# Patient Record
Sex: Female | Born: 1965 | Race: Black or African American | Hispanic: No | Marital: Single | Smoking: Current every day smoker
Health system: Southern US, Community
[De-identification: ages and names within clinical notes are randomized; demographics above are authoritative.]

## PROBLEM LIST (undated history)

## (undated) DIAGNOSIS — K859 Acute pancreatitis without necrosis or infection, unspecified: Secondary | ICD-10-CM

## (undated) DIAGNOSIS — K7290 Hepatic failure, unspecified without coma: Secondary | ICD-10-CM

---

## 2002-03-01 ENCOUNTER — Emergency Department (HOSPITAL_COMMUNITY): Admission: EM | Admit: 2002-03-01 | Discharge: 2002-03-01 | Payer: Self-pay | Admitting: Emergency Medicine

## 2002-03-02 ENCOUNTER — Encounter: Payer: Self-pay | Admitting: Emergency Medicine

## 2010-11-18 ENCOUNTER — Emergency Department: Payer: Self-pay | Admitting: Emergency Medicine

## 2010-11-19 ENCOUNTER — Emergency Department: Payer: Self-pay | Admitting: Emergency Medicine

## 2011-06-15 ENCOUNTER — Emergency Department: Payer: Self-pay | Admitting: Emergency Medicine

## 2011-12-15 IMAGING — CT CT HEAD WITHOUT CONTRAST
2 series · 16 of 30 positions shown, 20 images · non-contrast
Comparison: none

REASON FOR EXAM: L sided headache
COMMENTS:

PROCEDURE:     CT  - CT HEAD WITHOUT CONTRAST  - June 15, 2011  [DATE]
RESULT:     History: Headache.
Comparison Study: No prior.

[Series 2: without · axial · non-contrast · 0.43mm/px · z∈[-78,+42]mm · 13 of 30 slices shown, 17 images]
[im 3/30  brain]
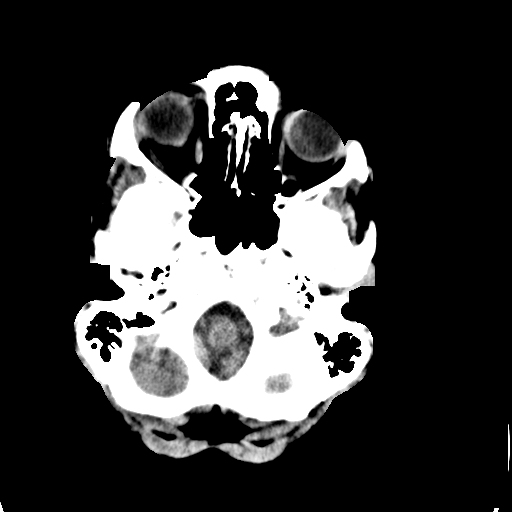
[im 3/30  bone]
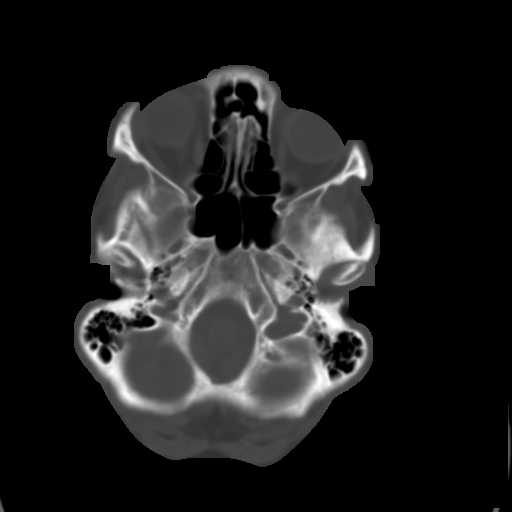
[im 5/30  brain]
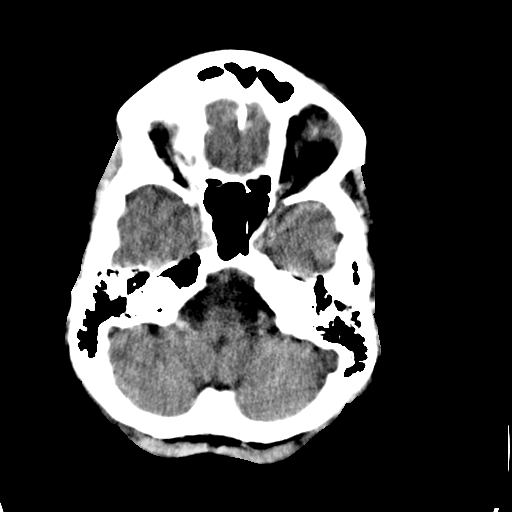
[im 7/30  brain]
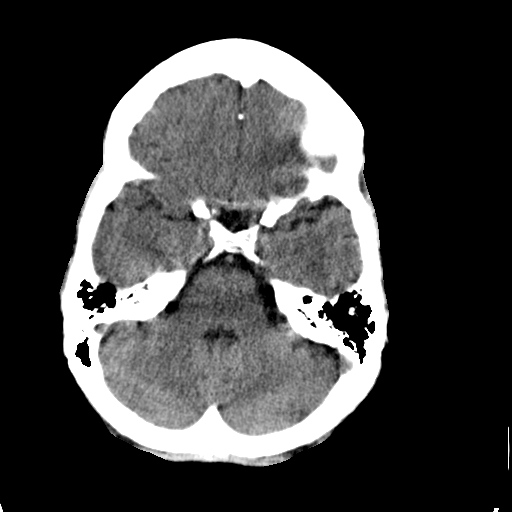
[im 9/30  brain]
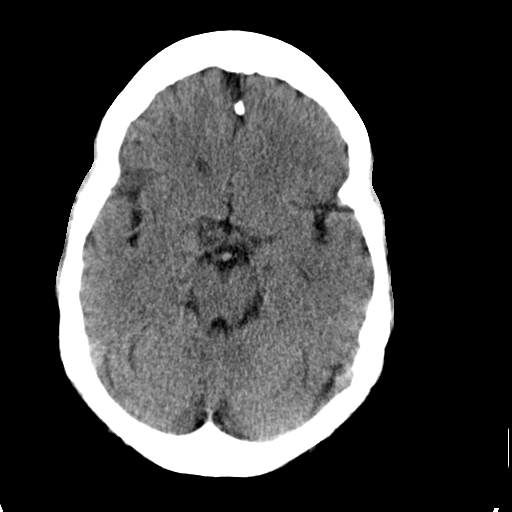
[im 11/30  brain]
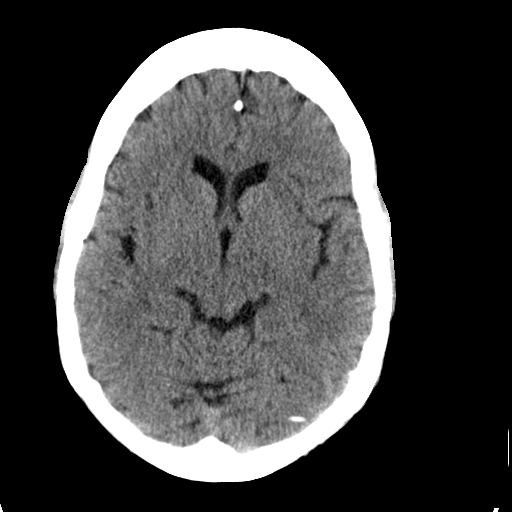
[im 11/30  bone]
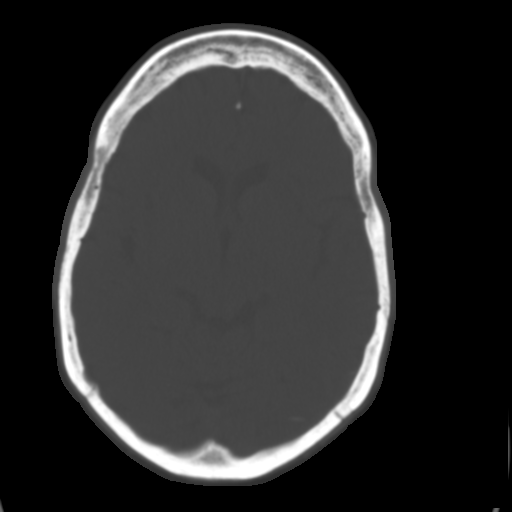
[im 13/30  brain]
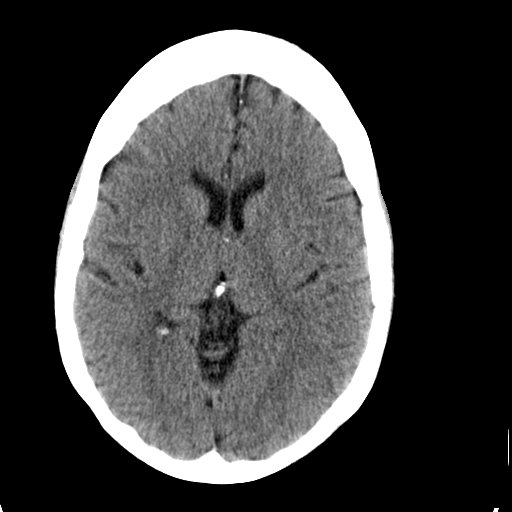
[im 15/30  brain]
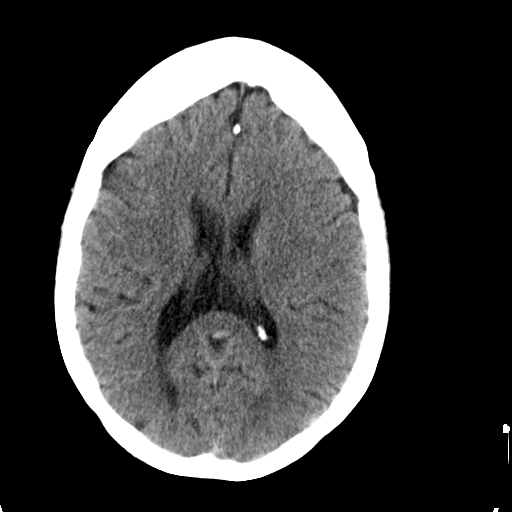
[im 17/30  brain]
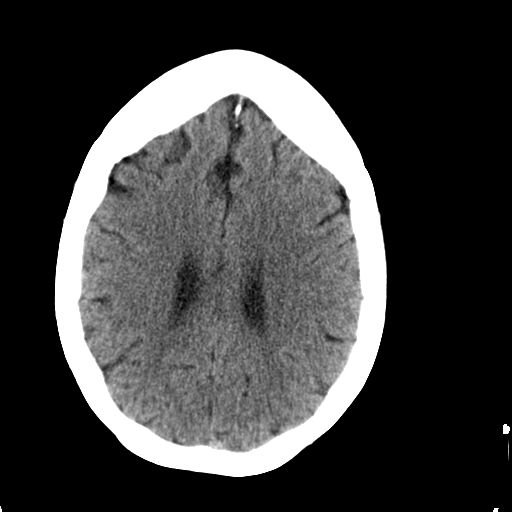
[im 19/30  brain]
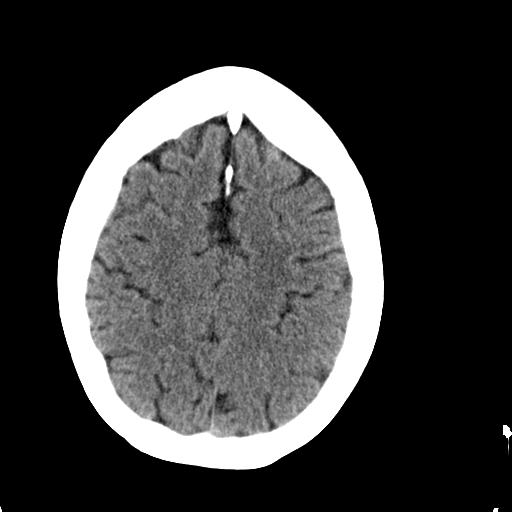
[im 19/30  bone]
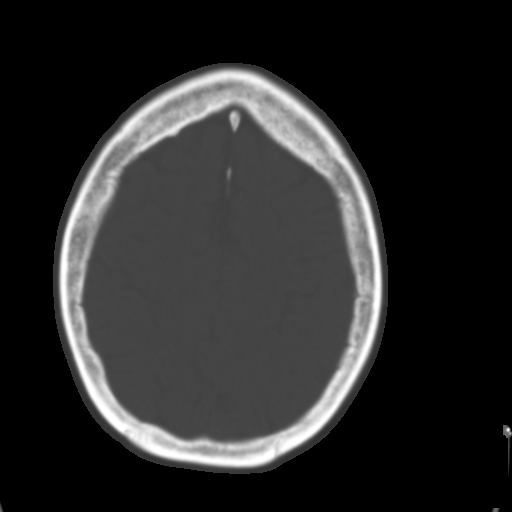
[im 21/30  brain]
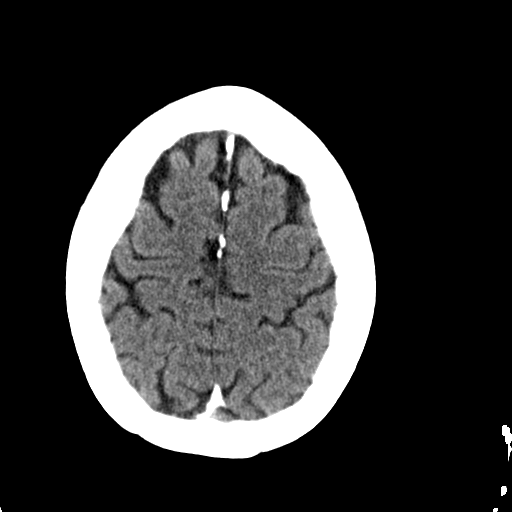
[im 23/30  brain]
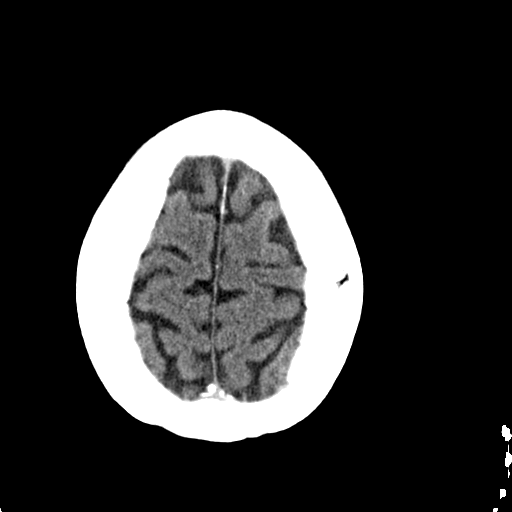
[im 25/30  brain]
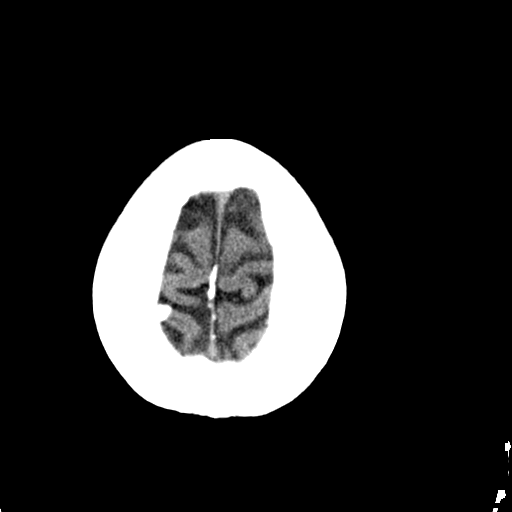
[im 27/30  brain]
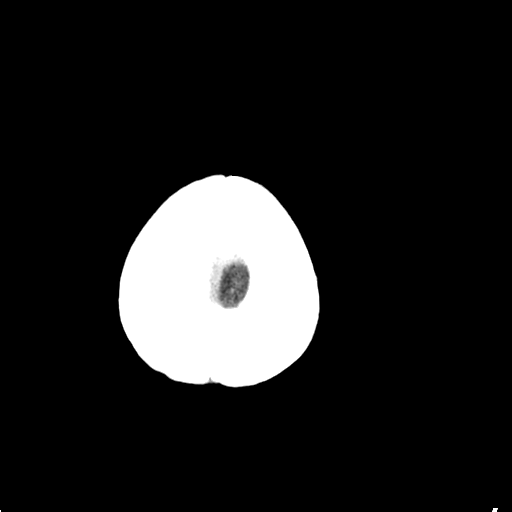
[im 27/30  bone]
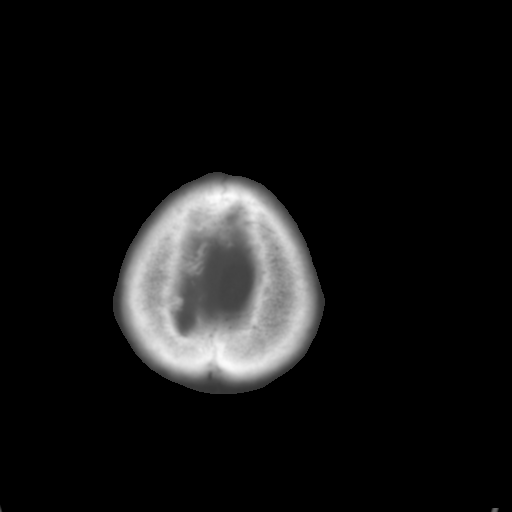

[Series 3: bone · axial · 0.43mm/px · z∈[-78,-38]mm · 3 of 30 slices shown]
[im 3/30  bone]
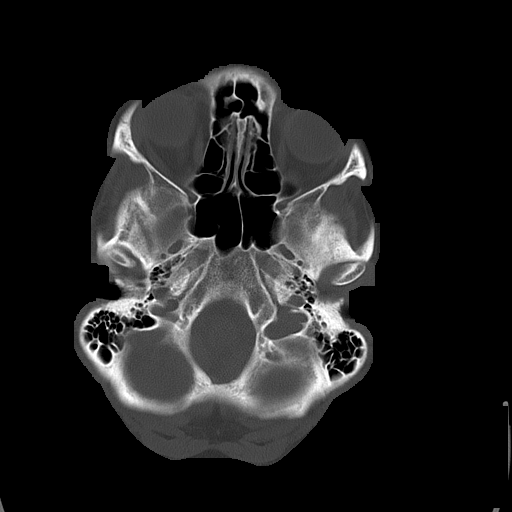
[im 7/30  bone]
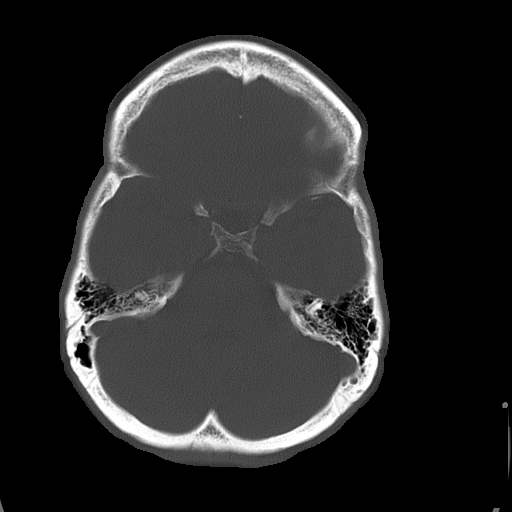
[im 11/30  bone]
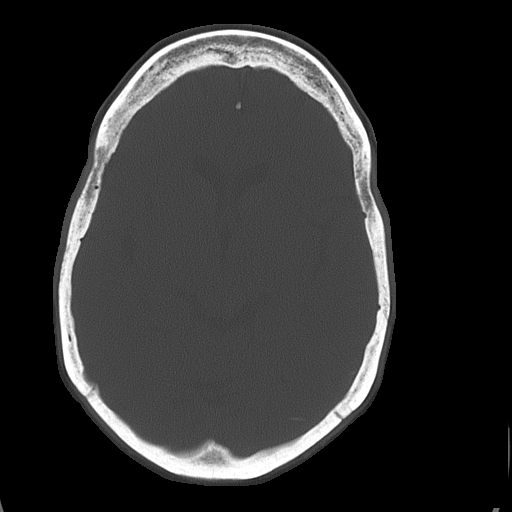

[16 of 30 positions shown; findings below may reference images not displayed]

FINDINGS: Standard nonenhanced CT obtained. No mass. No hydrocephalus. No
hemorrhage. No acute bony abnormalities .
IMPRESSION: No acute abnormality.

## 2013-05-18 ENCOUNTER — Emergency Department: Payer: Self-pay | Admitting: Unknown Physician Specialty

## 2013-05-18 LAB — DRUG SCREEN, URINE

## 2013-05-18 LAB — URINALYSIS, COMPLETE
Bilirubin,UR: NEGATIVE
Blood: NEGATIVE
Glucose,UR: NEGATIVE mg/dL (ref 0–75)
Ketone: NEGATIVE
Leukocyte Esterase: NEGATIVE
Nitrite: NEGATIVE
Ph: 5 (ref 4.5–8.0)
Protein: NEGATIVE
RBC,UR: 1 /HPF (ref 0–5)
Specific Gravity: 1.021 (ref 1.003–1.030)
Squamous Epithelial: 2
WBC UR: 1 /HPF (ref 0–5)

## 2013-05-18 LAB — COMPREHENSIVE METABOLIC PANEL
Albumin: 3.4 g/dL (ref 3.4–5.0)
Alkaline Phosphatase: 107 U/L (ref 50–136)
Anion Gap: 8 (ref 7–16)
BUN: 9 mg/dL (ref 7–18)
Bilirubin,Total: 0.4 mg/dL (ref 0.2–1.0)
Calcium, Total: 9.1 mg/dL (ref 8.5–10.1)
Chloride: 106 mmol/L (ref 98–107)
Co2: 25 mmol/L (ref 21–32)
Creatinine: 0.68 mg/dL (ref 0.60–1.30)
EGFR (African American): >60
EGFR (Non-African Amer.): >60
Glucose: 92 mg/dL (ref 65–99)
Osmolality: 276 (ref 275–301)
Potassium: 4.3 mmol/L (ref 3.5–5.1)
SGOT(AST): 23 U/L (ref 15–37)
SGPT (ALT): 14 U/L (ref 12–78)
Sodium: 139 mmol/L (ref 136–145)
Total Protein: 7.8 g/dL (ref 6.4–8.2)

## 2013-05-18 LAB — CBC
HCT: 40.7 % (ref 35.0–47.0)
HGB: 13.7 g/dL (ref 12.0–16.0)
MCH: 29.3 pg (ref 26.0–34.0)
MCHC: 33.7 g/dL (ref 32.0–36.0)
MCV: 87 fL (ref 80–100)
Platelet: 328 10*3/uL (ref 150–440)
RBC: 4.69 10*6/uL (ref 3.80–5.20)
RDW: 12.7 % (ref 11.5–14.5)
WBC: 9.6 10*3/uL (ref 3.6–11.0)

## 2013-05-18 LAB — ETHANOL
Ethanol %: <0.003 % (ref 0.000–0.080)
Ethanol: <3 mg/dL

## 2013-05-18 LAB — PROTIME-INR
INR: 0.9
Prothrombin Time: 12.7 secs (ref 11.5–14.7)

## 2013-05-18 LAB — APTT: Activated PTT: 33.6 secs (ref 23.6–35.9)

## 2013-05-18 LAB — AMMONIA: Ammonia, Plasma: 45 mcmol/L — ABNORMAL HIGH (ref 11–32)

## 2013-05-18 LAB — LIPASE, BLOOD: Lipase: 135 U/L (ref 73–393)

## 2013-07-04 ENCOUNTER — Emergency Department: Payer: Self-pay | Admitting: Emergency Medicine

## 2013-11-18 IMAGING — US ABDOMEN ULTRASOUND LIMITED
1 series · 14 of 25 positions shown · non-contrast
Comparison: none

REASON FOR EXAM: abd pain
COMMENTS:   Body Site: GB and Fossa, CBD, Head of Pancreas; Liver

PROCEDURE:     US  - US ABDOMEN LIMITED SURVEY  - May 18, 2013 [DATE]
RESULT:     Comparison: None.
TECHNIQUE: Multiple grayscale and color Doppler images were obtained of the
right upper quadrant.

[Series 1: abdomen ultrasound limited · 0.26mm/px · 14 of 49 slices shown]
[im 1/49]
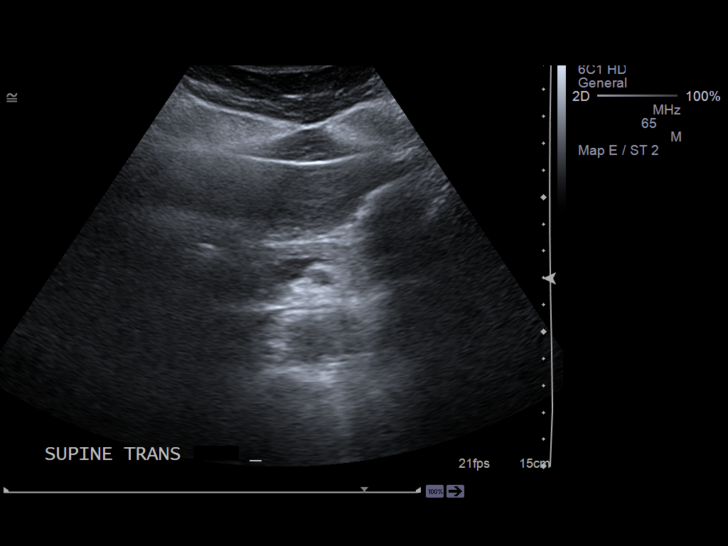
[im 5/49]
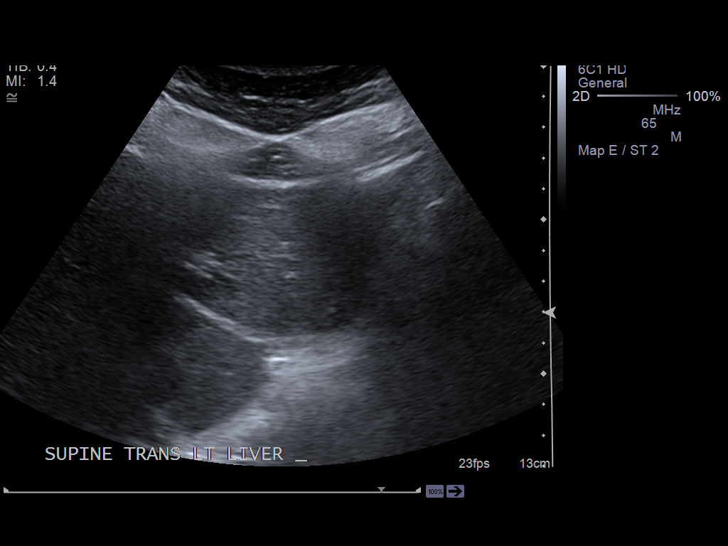
[im 9/49]
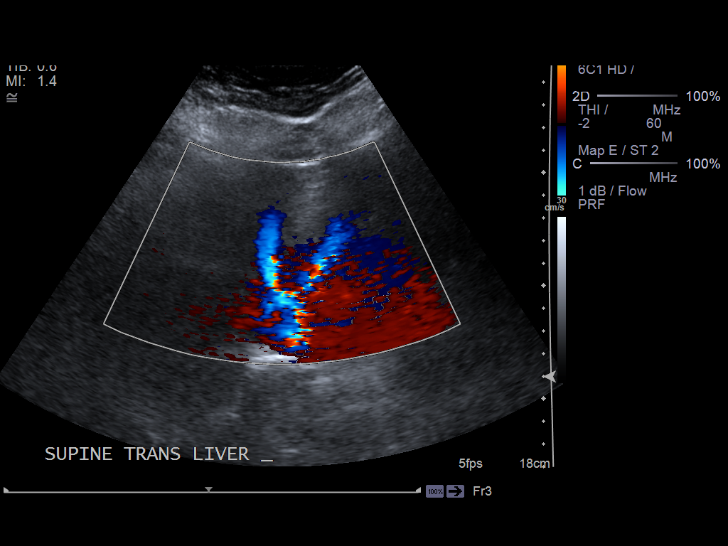
[im 13/49]
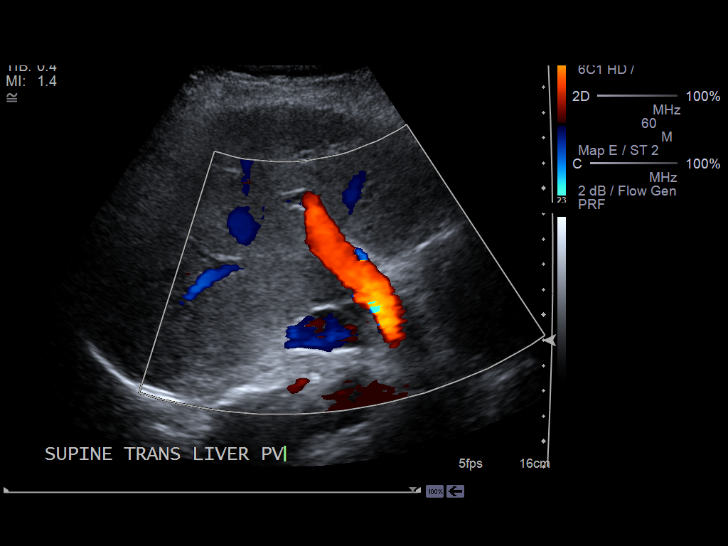
[im 17/49]
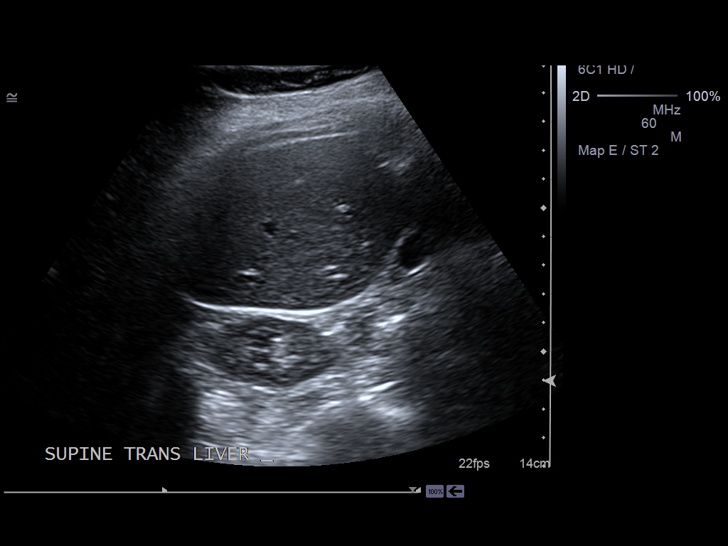
[im 19/49]
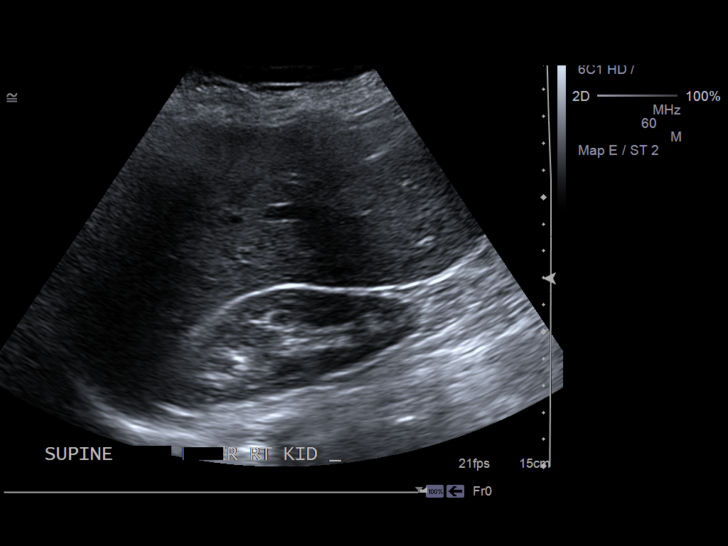
[im 23/49]
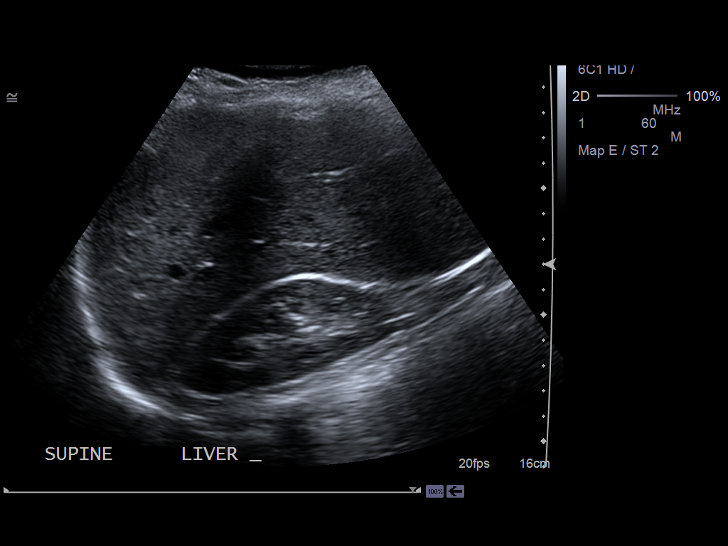
[im 27/49]
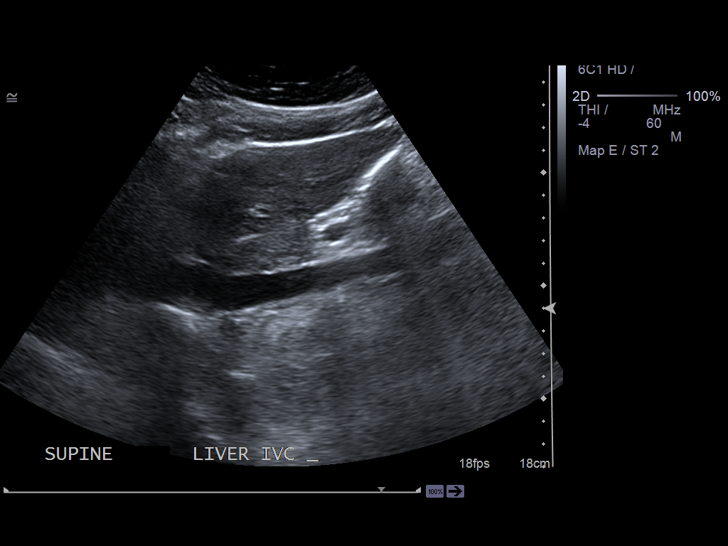
[im 31/49]
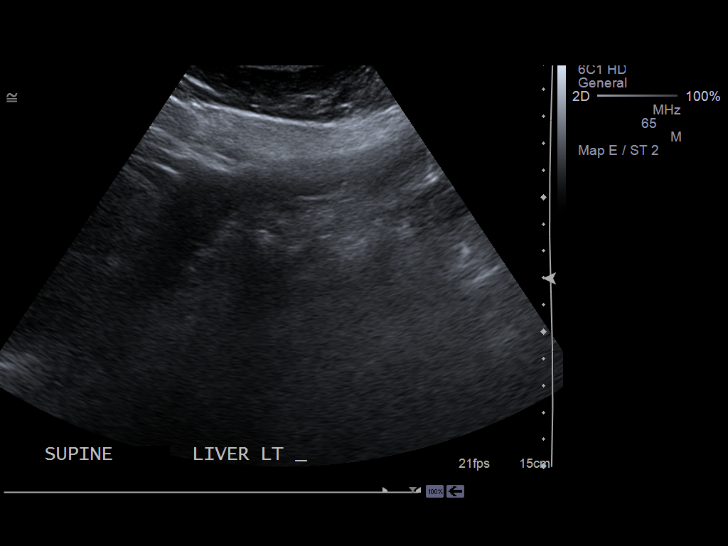
[im 33/49]
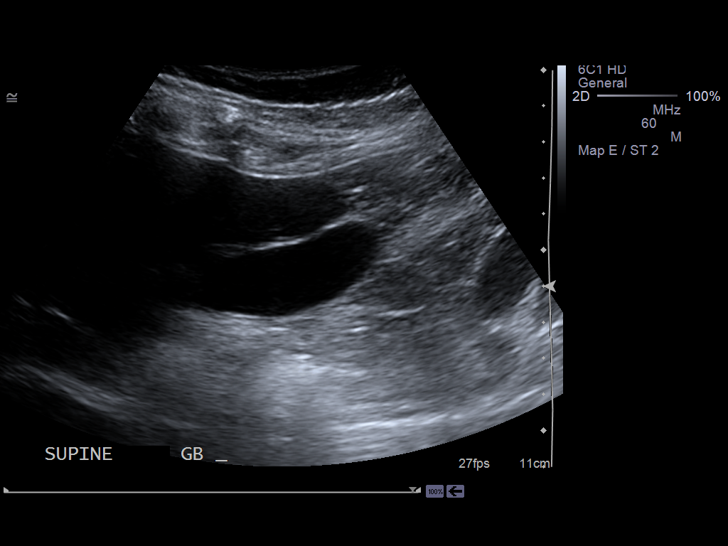
[im 37/49]
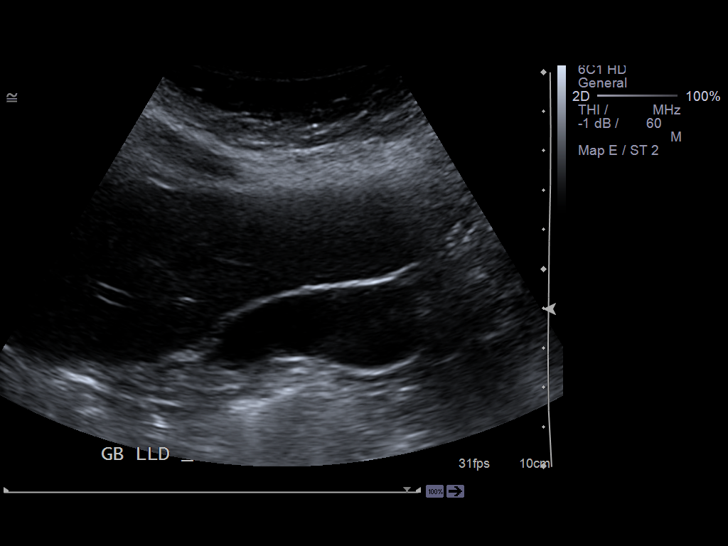
[im 41/49]
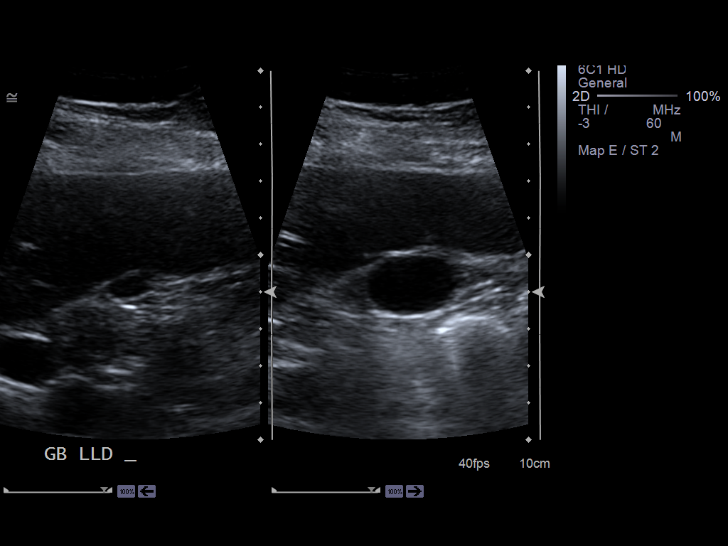
[im 45/49]
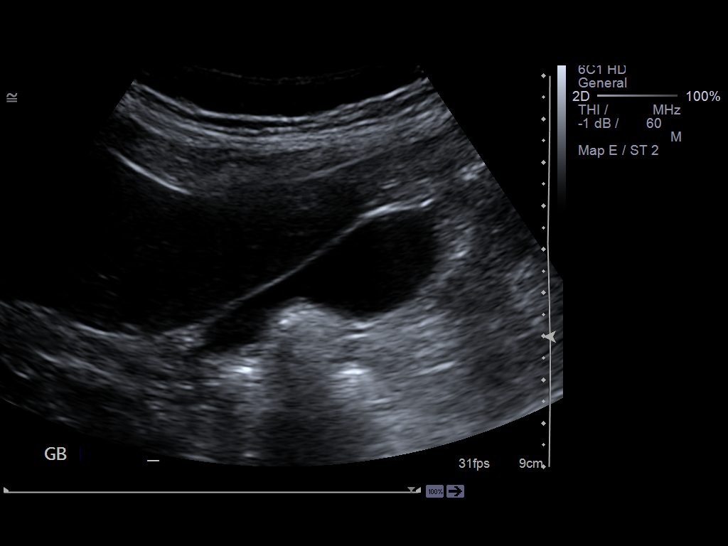
[im 49/49]
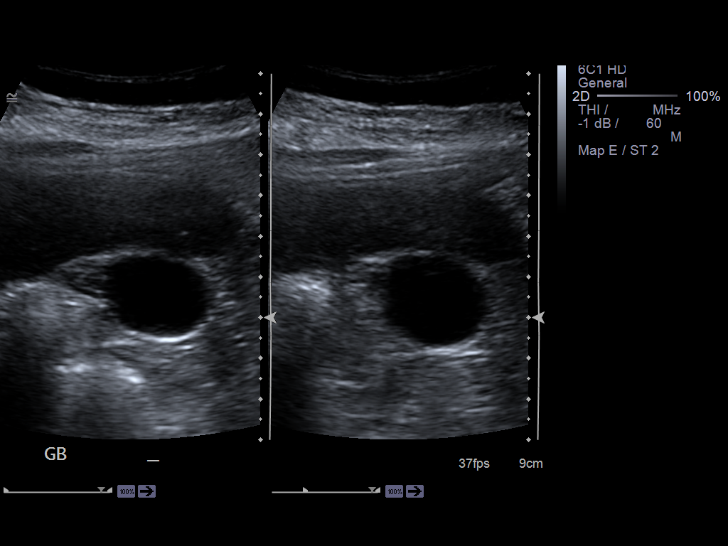

[14 of 25 positions shown; findings below may reference images not displayed]

FINDINGS: The pancreas was mostly obscured overlying bowel gas. Visualized portion the
pancreatic body is unremarkable. The liver measures 19.0 cm in length along
the midaxillary line which is borderline enlarged. The liver is otherwise
unremarkable. The main portal vein is patent. The gallbladder is normal.
Sonographic Murphy sign was negative. The common bile duct measures 4 mm in
diameter.
IMPRESSION: Normal gallbladder.

[REDACTED]

## 2014-04-17 ENCOUNTER — Ambulatory Visit: Payer: Self-pay

## 2014-07-17 ENCOUNTER — Ambulatory Visit: Payer: Self-pay

## 2018-11-03 ENCOUNTER — Encounter: Payer: Self-pay | Admitting: Intensive Care

## 2018-11-03 ENCOUNTER — Other Ambulatory Visit: Payer: Self-pay

## 2018-11-03 ENCOUNTER — Emergency Department
Admission: EM | Admit: 2018-11-03 | Discharge: 2018-11-03 | Disposition: A | Discharge status: Home / Self Care | Payer: Self-pay | Attending: Emergency Medicine | Admitting: Emergency Medicine

## 2018-11-03 DIAGNOSIS — F17210 Nicotine dependence, cigarettes, uncomplicated: Secondary | ICD-10-CM | POA: Insufficient documentation

## 2018-11-03 DIAGNOSIS — R109 Unspecified abdominal pain: Secondary | ICD-10-CM

## 2018-11-03 DIAGNOSIS — L0291 Cutaneous abscess, unspecified: Secondary | ICD-10-CM

## 2018-11-03 DIAGNOSIS — N764 Abscess of vulva: Secondary | ICD-10-CM | POA: Insufficient documentation

## 2018-11-03 HISTORY — DX: Hepatic failure, unspecified without coma: K72.90

## 2018-11-03 LAB — URINALYSIS, COMPLETE (UACMP) WITH MICROSCOPIC
Bilirubin Urine: NEGATIVE
Glucose, UA: NEGATIVE mg/dL
Hgb urine dipstick: NEGATIVE
Ketones, ur: NEGATIVE mg/dL
Leukocytes, UA: NEGATIVE
Nitrite: NEGATIVE
Protein, ur: NEGATIVE mg/dL
SPECIFIC GRAVITY, URINE: 1.02 (ref 1.005–1.030)
pH: 5 (ref 5.0–8.0)

## 2018-11-03 LAB — CBC
HEMATOCRIT: 44.7 % (ref 36.0–46.0)
Hemoglobin: 14.4 g/dL (ref 12.0–15.0)
MCH: 28.5 pg (ref 26.0–34.0)
MCHC: 32.2 g/dL (ref 30.0–36.0)
MCV: 88.5 fL (ref 80.0–100.0)
Platelets: 356 10*3/uL (ref 150–400)
RBC: 5.05 MIL/uL (ref 3.87–5.11)
RDW: 12.6 % (ref 11.5–15.5)
WBC: 7.1 10*3/uL (ref 4.0–10.5)
nRBC: 0 % (ref 0.0–0.2)

## 2018-11-03 LAB — COMPREHENSIVE METABOLIC PANEL
ALT: 13 U/L (ref 0–44)
AST: 18 U/L (ref 15–41)
Albumin: 4 g/dL (ref 3.5–5.0)
Alkaline Phosphatase: 71 U/L (ref 38–126)
Anion gap: 10 (ref 5–15)
BUN: 14 mg/dL (ref 6–20)
CHLORIDE: 106 mmol/L (ref 98–111)
CO2: 24 mmol/L (ref 22–32)
CREATININE: 0.78 mg/dL (ref 0.44–1.00)
Calcium: 9.3 mg/dL (ref 8.9–10.3)
GFR calc Af Amer: >60 mL/min (ref >60)
GFR calc non Af Amer: >60 mL/min (ref >60)
Glucose, Bld: 129 mg/dL — ABNORMAL HIGH (ref 70–99)
Potassium: 4 mmol/L (ref 3.5–5.1)
Sodium: 140 mmol/L (ref 135–145)
Total Bilirubin: 0.6 mg/dL (ref 0.3–1.2)
Total Protein: 7.8 g/dL (ref 6.5–8.1)

## 2018-11-03 LAB — LIPASE, BLOOD: Lipase: 46 U/L (ref 11–51)

## 2018-11-03 MED ORDER — PENTAFLUOROPROP-TETRAFLUOROETH EX AERO
INHALATION_SPRAY | CUTANEOUS | Status: AC
  Filled 2018-11-03: qty 30

## 2018-11-03 MED ORDER — TRAMADOL HCL 50 MG PO TABS: 50.0000 mg | ORAL_TABLET | Freq: Four times a day (QID) | ORAL | 0 refills | Status: AC | PRN

## 2018-11-03 MED ORDER — LIDOCAINE-EPINEPHRINE (PF) 2 %-1:200000 IJ SOLN
10.0000 mL | Freq: Once | INTRAMUSCULAR | Status: AC
  Administered 2018-11-03: 10 mL
  Filled 2018-11-03 (×2): qty 10

## 2018-11-03 MED ORDER — PENTAFLUOROPROP-TETRAFLUOROETH EX AERO
INHALATION_SPRAY | CUTANEOUS | Status: DC | PRN
  Administered 2018-11-03: 11:00:00 via TOPICAL

## 2018-11-03 NOTE — ED Triage Notes (Signed)
Patient has abscess present on Left sided labia that started about 2 weeks ago and has gradually gotten bigger and painful. No drainage. Patient also is having Right sided abdominal pain. HX alcohol abuse. HX liver failure.

## 2018-11-03 NOTE — ED Notes (Signed)
Pt stated she had to use the restroom prior to triage. Given cup for urine sample.

## 2018-11-03 NOTE — ED Provider Notes (Signed)
[facility] Emergency Department Provider Note  ____________________________________________   I have reviewed the triage vital signs and the nursing notes. Where available I have reviewed prior notes and, if possible and indicated, outside hospital notes.    HISTORY  Chief Complaint Abscess and Abdominal Pain (right side)    HPI Natalie Oliver is a 52 y.o. female he states that she has had an abscess on her left labial region for the last couple weeks.  No fever no chills.  Patient states that she has chronic right-sided abdominal pain off and on for years, but she is not actually having it today.  She states her only reason for being here today is that she has an abscess.  She has no fever, she has no systemic symptoms.  Is a small area about the size of a golf ball on the left labial region with no other complaints.      Past Medical History:  Diagnosis Date  . Liver failure (HCC)     There are no active problems to display for this patient.   History reviewed. No pertinent surgical history.  Prior to Admission medications   Not on File    Allergies Tylenol [acetaminophen]  History reviewed. No pertinent family history.  Social History Social History   Tobacco Use  . Smoking status: Current Every Day Smoker    Types: Cigarettes  . Smokeless tobacco: Never Used  Substance Use Topics  . Alcohol use: Yes    Alcohol/week: 24.0 standard drinks    Types: 24 Cans of beer per week  . Drug use: Yes    Types: Cocaine    Review of Systems Constitutional: No fever/chills Eyes: No visual changes. ENT: No sore throat. No stiff neck no neck pain Cardiovascular: Denies chest pain. Respiratory: Denies shortness of breath. Gastrointestinal:   no vomiting.  No diarrhea.  No constipation. Genitourinary: Negative for dysuria. Musculoskeletal: Negative lower extremity swelling Skin: See above Neurological: Negative for severe headaches, focal  weakness or numbness.   ____________________________________________   PHYSICAL EXAM:  VITAL SIGNS: ED Triage Vitals  Enc Vitals Group     BP 11/03/18 0858 129/75     Pulse Rate 11/03/18 0858 89     Resp 11/03/18 0858 20     Temp 11/03/18 0858 98.3 F (36.8 C)     Temp Source 11/03/18 0858 Oral     SpO2 11/03/18 0858 98 %     Weight 11/03/18 0858 155 lb (70.3 kg)     Height 11/03/18 0858 5\' 4"  (1.626 m)     Head Circumference --      Peak Flow --      Pain Score 11/03/18 0911 8     Pain Loc --      Pain Edu? --      Excl. in [place]? --     Constitutional: Alert and oriented. Well appearing and in no acute distress. Eyes: Conjunctivae are normal Head: Atraumatic HEENT: No congestion/rhinnorhea. Mucous membranes are moist.  Oropharynx non-erythematous Neck:   Nontender with no meningismus, no masses, no stridor Cardiovascular: Normal rate, regular rhythm. Grossly normal heart sounds.  Good peripheral circulation. Respiratory: Normal respiratory effort.  No retractions. Lungs CTAB. Abdominal: Soft and nontender. No distention. No guarding no rebound Back:  There is no focal tenderness or step off.  there is no midline tenderness there are no lesions noted. there is no CVA tenderness : Patient has a abscess to the left labia majora with surrounding  cellulitis, at about 2.5 cm, superficial, fluctuant tender and red Musculoskeletal: No lower extremity tenderness, no upper extremity tenderness. No joint effusions, no DVT signs strong distal pulses no edema Neurologic:  Normal speech and language. No gross focal neurologic deficits are appreciated.  Skin:  Skin is warm, dry and intact. No rash noted. Psychiatric: Mood and affect are normal. Speech and behavior are normal.  ____________________________________________   LABS (all labs ordered are listed, but only abnormal results are displayed)  Labs Reviewed  COMPREHENSIVE METABOLIC PANEL - Abnormal; Notable for the following  components:      Result Value   Glucose, Bld 129 (*)    All other components within normal limits  URINALYSIS, COMPLETE (UACMP) WITH MICROSCOPIC - Abnormal; Notable for the following components:   Color, Urine YELLOW (*)    APPearance HAZY (*)    Bacteria, UA RARE (*)    All other components within normal limits  AEROBIC CULTURE (SUPERFICIAL SPECIMEN)  LIPASE, BLOOD  CBC    Pertinent labs  results that were available during my care of the patient were reviewed by me and considered in my medical decision making (see chart for details). ____________________________________________  EKG  I personally interpreted any EKGs ordered by me or triage  ____________________________________________  RADIOLOGY  Pertinent labs & imaging results that were available during my care of the patient were reviewed by me and considered in my medical decision making (see chart for details). If possible, patient and/or family made aware of any abnormal findings.  No results found. ____________________________________________    PROCEDURES  Procedure(s) performed:   Marland KitchenMarland KitchenIncision and Drainage Date/Time: 11/03/2018 11:51 AM Performed by: Jeanmarie Plant, MD Authorized by: Jeanmarie Plant, MD   Consent:    Consent obtained:  Verbal   Consent given by:  Patient   Risks discussed:  Bleeding, incomplete drainage, pain, damage to other organs and infection   Alternatives discussed:  No treatment, delayed treatment, alternative treatment and observation Location:    Type:  Abscess   Location:  Anogenital   Anogenital location:  Vulva Pre-procedure details:    Skin preparation:  Antiseptic wash and Betadine Anesthesia (see MAR for exact dosages):    Anesthesia method:  Local infiltration   Local anesthetic:  Lidocaine 2% WITH epi Procedure type:    Complexity:  Simple Procedure details:    Needle aspiration: no     Incision types:  Single straight   Incision depth:  Subcutaneous   Scalpel  blade:  11   Wound management:  Probed and deloculated, irrigated with saline and extensive cleaning   Drainage:  Purulent   Drainage amount:  Moderate   Packing materials:  1/2 in gauze Post-procedure details:    Patient tolerance of procedure:  Tolerated well, no immediate complications    Critical Care performed: None  ____________________________________________   INITIAL IMPRESSION / ASSESSMENT AND PLAN / ED COURSE  Pertinent labs & imaging results that were available during my care of the patient were reviewed by me and considered in my medical decision making (see chart for details).  Patient with 2 complaints of versus she has chronic abdominal pain I see no evidence of any acute pathology today on exam and she states is not actually having the pain today we will defer that to primary care as an outpatient.  Second is an abscess.  I performed an I&D on the abscess there is no surrounding cellulitis, packing is been placed, patient will return in 48 hours for packing  removal, return precautions help given understood.  No antibiotics are indicated.    ____________________________________________   FINAL CLINICAL IMPRESSION(S) / ED DIAGNOSES  Final diagnoses:  None      This chart was dictated using voice recognition software.  Despite best efforts to proofread,  errors can occur which can change meaning.      Jeanmarie Plant, MD 11/03/18 1153

## 2018-11-03 NOTE — Discharge Instructions (Addendum)
You have fever, worsening pain, worsening redness worsening swelling or you feel worse in any way return to the emergency room.  Otherwise follow closely with primary care doctor in 2 days.  If you do not have a primary care doctor, return to this department as needed for further care as discussed we also asked that you follow-up with GI medicine and primary care for your chronic abdominal pain.  Do not drink alcohol to excess, if your pain worsens or you have other complaints please return to the ER

## 2018-11-06 LAB — AEROBIC CULTURE W GRAM STAIN (SUPERFICIAL SPECIMEN)

## 2018-11-06 LAB — AEROBIC CULTURE  (SUPERFICIAL SPECIMEN): CULTURE: NORMAL

## 2023-03-01 ENCOUNTER — Emergency Department
Admission: EM | Admit: 2023-03-01 | Discharge: 2023-03-01 | Disposition: A | Discharge status: Home / Self Care | Payer: Commercial Managed Care - HMO | Attending: Emergency Medicine | Admitting: Emergency Medicine

## 2023-03-01 ENCOUNTER — Emergency Department: Payer: Commercial Managed Care - HMO

## 2023-03-01 DIAGNOSIS — M5412 Radiculopathy, cervical region: Secondary | ICD-10-CM

## 2023-03-01 DIAGNOSIS — M5416 Radiculopathy, lumbar region: Secondary | ICD-10-CM | POA: Insufficient documentation

## 2023-03-01 DIAGNOSIS — R001 Bradycardia, unspecified: Secondary | ICD-10-CM | POA: Insufficient documentation

## 2023-03-01 DIAGNOSIS — R200 Anesthesia of skin: Secondary | ICD-10-CM | POA: Diagnosis present

## 2023-03-01 MED ORDER — MELOXICAM 15 MG PO TABS: 15.0000 mg | ORAL_TABLET | Freq: Every day | ORAL | 0 refills | Status: AC

## 2023-03-01 MED ORDER — METHOCARBAMOL 500 MG PO TABS: 500.0000 mg | ORAL_TABLET | Freq: Four times a day (QID) | ORAL | 0 refills | Status: AC

## 2023-03-01 NOTE — ED Triage Notes (Signed)
Pt sts that she has been having left arm pain from her finger tips all the way up to her shoulder. Pt sts that she is also having swelling to the arm and fingers also.

## 2023-03-01 NOTE — ED Provider Notes (Signed)
[facility] Provider Note  Patient Contact: 5:08 PM (approximate)   History   Arm Pain   HPI  Natalie Oliver is a 57 y.o. female who presents the emergency department complaining of numbness, pain starting in her left hand, ascending upper left arm.  Symptoms began yesterday.  She has no headache, visual changes, unilateral weakness, slurred speech, chest pain, shortness of breath.  No reported trauma.  No edema, erythema of the left upper extremity.     Physical Exam   Triage Vital Signs: ED Triage Vitals [03/01/23 1515]  Enc Vitals Group     BP (!) 141/101     Pulse Rate 82     Resp 17     Temp 98.1 F (36.7 C)     Temp Source Oral     SpO2 96 %     Weight 165 lb (74.8 kg)     Height      Head Circumference      Peak Flow      Pain Score 9     Pain Loc      Pain Edu?      Excl. in [place]?     Most recent vital signs: Vitals:   03/01/23 1515 03/01/23 1847  BP: (!) 141/101 (!) 140/92  Pulse: 82 82  Resp: 17 17  Temp: 98.1 F (36.7 C) 98.1 F (36.7 C)  SpO2: 96% 97%     General: Alert and in no acute distress  Neck: No stridor. No cervical spine tenderness to palpation.  Cardiovascular:  Good peripheral perfusion Respiratory: Normal respiratory effort without tachypnea or retractions. Lungs CTAB. Good air entry to the bases with no decreased or absent breath sounds Musculoskeletal: Full range of motion to all extremities.  Neurologic:  No gross focal neurologic deficits are appreciated.  Skin:   No rash noted Other:   ED Results / Procedures / Treatments   Labs (all labs ordered are listed, but only abnormal results are displayed) Labs Reviewed - No data to display   EKG  ED ECG REPORT I, Charline Bills Dejon Jungman,  personally viewed and interpreted this ECG.   Date: 03/01/2023  EKG Time: 1839 hrs.  Rate: 58 bpm  Rhythm: unchanged from previous tracings, sinus bradycardia  Axis: Normal axis  Intervals:none  ST&T Change:  No gross ST elevation or depression noted  Sinus bradycardia.  No STEMI.    RADIOLOGY  I personally viewed, evaluated, and interpreted these images as part of my medical decision making, as well as reviewing the written report by the radiologist.  ED Provider Interpretation: Imaging of the cervical spine, humerus and forearm is unremarkable.  No results found.  PROCEDURES:  Critical Care performed: No  Procedures   MEDICATIONS ORDERED IN ED: Medications - No data to display   IMPRESSION / MDM / [place] / ED COURSE  I reviewed the triage vital signs and the nursing notes.                              Clinical Course as of 03/01/23 2015  Tue Mar 01, 2023  1949 DG Humerus Left [HH]    Clinical Course User Index [HH] Prince Rome, Student-PA    Differential diagnosis includes, but is not limited to, cervical radiculopathy, STEMI, impingement syndrome of the shoulder, carpal tunnel  Patient's presentation is most consistent with acute presentation with potential threat to life or bodily function.  Patient's diagnosis is consistent with cervical radiculopathy.  Patient presents to the emergency department with intermittent numbness and pain running down the entire left arm from the neck region.  No chest pain, shortness of breath.  Symptoms started more in the hand and forearm initially but has now involve the entire left arm.  EKG is reassuring with no evidence of cardiac injury.  Based off of physical exam I suspect that this was musculoskeletal.  Imaging is reassuring.  Will treat with anti-inflammatory muscle relaxer for the patient..  Follow-up primary care as needed.  Return precautions discussed with patient.  Patient is given ED precautions to return to the ED for any worsening or new symptoms.     FINAL CLINICAL IMPRESSION(S) / ED DIAGNOSES   Final diagnoses:  Cervical radiculopathy     Rx / DC Orders   ED Discharge Orders          Ordered     meloxicam (MOBIC) 15 MG tablet  Daily        03/01/23 2005    methocarbamol (ROBAXIN) 500 MG tablet  4 times daily        03/01/23 2005             Note:  This document was prepared using Dragon voice recognition software and may include unintentional dictation errors.   Brynda Peon 03/01/23 2015    Duffy Bruce, MD 03/03/23 [phone]

## 2024-07-09 ENCOUNTER — Other Ambulatory Visit: Payer: Self-pay

## 2024-07-09 ENCOUNTER — Emergency Department
Admission: EM | Admit: 2024-07-09 | Discharge: 2024-07-09 | Disposition: A | Discharge status: Home / Self Care | Payer: Self-pay | Attending: Emergency Medicine | Admitting: Emergency Medicine

## 2024-07-09 DIAGNOSIS — W57XXXA Bitten or stung by nonvenomous insect and other nonvenomous arthropods, initial encounter: Secondary | ICD-10-CM | POA: Insufficient documentation

## 2024-07-09 DIAGNOSIS — S40862A Insect bite (nonvenomous) of left upper arm, initial encounter: Secondary | ICD-10-CM | POA: Insufficient documentation

## 2024-07-09 DIAGNOSIS — S40861A Insect bite (nonvenomous) of right upper arm, initial encounter: Secondary | ICD-10-CM | POA: Insufficient documentation

## 2024-07-09 DIAGNOSIS — K0889 Other specified disorders of teeth and supporting structures: Secondary | ICD-10-CM

## 2024-07-09 DIAGNOSIS — S80861A Insect bite (nonvenomous), right lower leg, initial encounter: Secondary | ICD-10-CM | POA: Insufficient documentation

## 2024-07-09 DIAGNOSIS — S80862A Insect bite (nonvenomous), left lower leg, initial encounter: Secondary | ICD-10-CM | POA: Insufficient documentation

## 2024-07-09 DIAGNOSIS — K029 Dental caries, unspecified: Secondary | ICD-10-CM | POA: Insufficient documentation

## 2024-07-09 HISTORY — DX: Acute pancreatitis without necrosis or infection, unspecified: K85.90

## 2024-07-09 LAB — BASIC METABOLIC PANEL WITH GFR
Anion gap: 10 (ref 5–15)
BUN: 11 mg/dL (ref 6–20)
CO2: 25 mmol/L (ref 22–32)
Calcium: 9.1 mg/dL (ref 8.9–10.3)
Chloride: 106 mmol/L (ref 98–111)
Creatinine, Ser: 0.67 mg/dL (ref 0.44–1.00)
GFR, Estimated: >60 mL/min (ref >60)
Glucose, Bld: 83 mg/dL (ref 70–99)
Potassium: 4.1 mmol/L (ref 3.5–5.1)
Sodium: 141 mmol/L (ref 135–145)

## 2024-07-09 LAB — CBC WITH DIFFERENTIAL/PLATELET
Abs Immature Granulocytes: 0.02 K/uL (ref 0.00–0.07)
Basophils Absolute: 0.1 K/uL (ref 0.0–0.1)
Basophils Relative: 1 %
Eosinophils Absolute: 0.1 K/uL (ref 0.0–0.5)
Eosinophils Relative: 1 %
HCT: 44.3 % (ref 36.0–46.0)
Hemoglobin: 15 g/dL (ref 12.0–15.0)
Immature Granulocytes: 0 %
Lymphocytes Relative: 21 %
Lymphs Abs: 1.5 K/uL (ref 0.7–4.0)
MCH: 30.1 pg (ref 26.0–34.0)
MCHC: 33.9 g/dL (ref 30.0–36.0)
MCV: 88.8 fL (ref 80.0–100.0)
Monocytes Absolute: 0.5 K/uL (ref 0.1–1.0)
Monocytes Relative: 7 %
Neutro Abs: 4.9 K/uL (ref 1.7–7.7)
Neutrophils Relative %: 70 %
Platelets: 270 K/uL (ref 150–400)
RBC: 4.99 MIL/uL (ref 3.87–5.11)
RDW: 12.6 % (ref 11.5–15.5)
WBC: 7 K/uL (ref 4.0–10.5)
nRBC: 0 % (ref 0.0–0.2)

## 2024-07-09 MED ORDER — AMOXICILLIN 500 MG PO TABS: 500.0000 mg | ORAL_TABLET | Freq: Three times a day (TID) | ORAL | 0 refills | Status: AC

## 2024-07-09 NOTE — ED Triage Notes (Addendum)
 Pt to ED for L dental pain upper and lower since 4 days. States has a lot of dental problems. Slight swelling to L cheek noted. Pt also has bites all over body that are itchy since last week.

## 2024-07-09 NOTE — ED Provider Notes (Signed)
 [facility] Provider Note    Event Date/Time   First MD Initiated Contact with Patient 07/09/24 [phone]     (approximate)   History   Dental Pain and Insect Bite   HPI  Natalie Oliver is a 57 y.o. female with PMH of liver failure and pancreatitis who presents for evaluation of dental pain and insect bites. Patient states the dental pain began a few days ago, no fever or drainage or swelling. She has also noticed some insect bites on her arms and legs that began a last week.      Physical Exam   Triage Vital Signs: ED Triage Vitals  Encounter Vitals Group     BP 07/09/24 0856 (!) 150/96     Girls Systolic BP Percentile --      Girls Diastolic BP Percentile --      Boys Systolic BP Percentile --      Boys Diastolic BP Percentile --      Pulse Rate 07/09/24 0855 65     Resp 07/09/24 0855 17     Temp 07/09/24 0855 98.8 F (37.1 C)     Temp Source 07/09/24 0855 Oral     SpO2 07/09/24 0855 100 %     Weight 07/09/24 0856 140 lb (63.5 kg)     Height 07/09/24 0856 5' 4 (1.626 m)     Head Circumference --      Peak Flow --      Pain Score 07/09/24 0858 8     Pain Loc --      Pain Education --      Exclude from Growth Chart --     Most recent vital signs: Vitals:   07/09/24 0855 07/09/24 0856  BP:  (!) 150/96  Pulse: 65   Resp: 17   Temp: 98.8 F (37.1 C)   SpO2: 100%     General: Awake, no distress.  CV:  Good peripheral perfusion.  Resp:  Normal effort.  Abd:  No distention.  Other:  Poor dentition throughout the mouth, erythema noted to the left upper and lower gums, no abscess felt, no facial swelling, multiple teeth with decay, small insect bites in a line with overlying excoriations on the arms and legs, none between the fingers.   ED Results / Procedures / Treatments   Labs (all labs ordered are listed, but only abnormal results are displayed) Labs Reviewed  CBC WITH DIFFERENTIAL/PLATELET  BASIC METABOLIC PANEL WITH GFR     PROCEDURES:  Critical Care performed: No  Procedures   MEDICATIONS ORDERED IN ED: Medications - No data to display   IMPRESSION / MDM / ASSESSMENT AND PLAN / ED COURSE  I reviewed the triage vital signs and the nursing notes.                             58 year old female presents for evaluation of left upper and lower dental pain as well as insect bites all over her body.  Differential diagnosis includes, but is not limited to, dental infection, cavities, abscess, bedbugs, fleabites, scabies.  Patient's presentation is most consistent with acute, uncomplicated illness.  BMP and CBC are unremarkable.  Patient has poor dentition throughout her mouth and is tender to percussion over multiple teeth in the left upper and lower jaw, did not feel an abscess on exam.  Will start patient on oral antibiotics.  Explained that she will need to follow-up with  a dentist.  Bites on her arms and legs appears consistent with bedbugs.  Discussed symptomatic management using hydrocortisone cream and allergy medicine.  Also discussed that she will need to have an exterminator come to her house.  She suspects that she picked this up from work.  Patient voiced understanding, all questions were answered and she was stable at discharge.      FINAL CLINICAL IMPRESSION(S) / ED DIAGNOSES   Final diagnoses:  Pain, dental  Bedbug bite, initial encounter     Rx / DC Orders   ED Discharge Orders          Ordered    amoxicillin  (AMOXIL ) 500 MG tablet  3 times daily        07/09/24 1026             Note:  This document was prepared using Dragon voice recognition software and may include unintentional dictation errors.   Cleaster Tinnie LABOR, PA-C 07/09/24 1027    Viviann Pastor, MD 07/09/24 1515

## 2024-07-09 NOTE — Discharge Instructions (Addendum)
 Please take the antibiotics as prescribed.  I believe your dental pain is a result of an infection.  You will need to follow-up with a dentist.  There are follow-up options listed below.  You can take 650 mg of Tylenol and 600 mg of ibuprofen every 6 hours as needed for pain. You can use ice, heat, muscle creams and other topical pain relievers as well.  I believe the bites on your arms are from bedbugs.  Please wash all of your sheets and close in very hot water.  I also recommend reaching out to an exterminator to come treat your house. You can apply hydrocortisone cream over the bites to help with itching.  I also encourage you to take allergy medication as well.
